# Patient Record
Sex: Female | Born: 1995 | Race: Black or African American | Hispanic: No | Marital: Married | Smoking: Never smoker
Health system: Southern US, Community
[De-identification: ages and names within clinical notes are randomized; demographics above are authoritative.]

## PROBLEM LIST (undated history)

## (undated) DIAGNOSIS — Z803 Family history of malignant neoplasm of breast: Secondary | ICD-10-CM

## (undated) DIAGNOSIS — D649 Anemia, unspecified: Secondary | ICD-10-CM

## (undated) HISTORY — DX: Anemia, unspecified: D64.9

## (undated) HISTORY — DX: Family history of malignant neoplasm of breast: Z80.3

---
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
## 2017-05-01 ENCOUNTER — Ambulatory Visit (HOSPITAL_BASED_OUTPATIENT_CLINIC_OR_DEPARTMENT_OTHER): Payer: 59 | Admitting: Genetic Counselor

## 2017-05-01 ENCOUNTER — Encounter: Payer: Self-pay | Admitting: Genetic Counselor

## 2017-05-01 ENCOUNTER — Other Ambulatory Visit: Payer: 59

## 2017-05-01 DIAGNOSIS — Z808 Family history of malignant neoplasm of other organs or systems: Secondary | ICD-10-CM | POA: Diagnosis not present

## 2017-05-01 DIAGNOSIS — Z315 Encounter for genetic counseling: Secondary | ICD-10-CM | POA: Diagnosis not present

## 2017-05-01 DIAGNOSIS — Z809 Family history of malignant neoplasm, unspecified: Secondary | ICD-10-CM | POA: Diagnosis not present

## 2017-05-01 DIAGNOSIS — Z803 Family history of malignant neoplasm of breast: Secondary | ICD-10-CM | POA: Diagnosis not present

## 2017-05-01 NOTE — Progress Notes (Addendum)
REFERRING PROVIDER: Tyson Dense, MD [address], [place] [address]  PRIMARY PROVIDER:  System, Pcp Not In  PRIMARY REASON FOR VISIT:  1. Family history of breast cancer      HISTORY OF PRESENT ILLNESS:   Tina Arias, a 20 y.o. female, was seen for a [facility] cancer genetics consultation at the request of Dr. Royston Sinner due to a family history of cancer.  Tina Arias presents to clinic today to discuss the possibility of a hereditary predisposition to cancer, genetic testing, and to further clarify her future cancer risks, as well as potential cancer risks for family members. Tina Arias is a 21 y.o. female with no personal history of cancer.  She was seen with her mother who had been diagnosed with breast cancer at age 66.  Her mother underwent genetic testing in 2006 was BRCA neg at that time.  CANCER HISTORY:   No history exists.     HORMONAL RISK FACTORS:  Menarche was at age 48.  First live birth at age N/A.  OCP use for approximately 5 years.  Ovaries intact: yes.  Hysterectomy: no.  Menopausal status: premenopausal.  HRT use: 0 years. Colonoscopy: no; not examined. Mammogram within the last year: no. Number of breast biopsies: 0. Up to date with pelvic exams:  yes. Any excessive radiation exposure in the past:  no  Past Medical History:  Diagnosis Date  . Anemia   . Family history of breast cancer     No past surgical history on file.  Social History   Socioeconomic History  . Marital status: Single    Spouse name: Not on file  . Number of children: Not on file  . Years of education: Not on file  . Highest education level: Not on file  Social Needs  . Financial resource strain: Not on file  . Food insecurity - worry: Not on file  . Food insecurity - inability: Not on file  . Transportation needs - medical: Not on file  . Transportation needs - non-medical: Not on file  Occupational History  . Not on file  Tobacco Use  . Smoking  status: Never Smoker  . Smokeless tobacco: Never Used  Substance and Sexual Activity  . Alcohol use: Not on file  . Drug use: Not on file  . Sexual activity: Yes    Birth control/protection: Pill  Other Topics Concern  . Not on file  Social History Narrative  . Not on file     FAMILY HISTORY:  We obtained a detailed, 4-generation family history.  Significant diagnoses are listed below: Family History  Problem Relation Age of Onset  . Breast cancer Mother 88  . Other Brother        car accident  . Cervical cancer Maternal Grandmother        dx in her 71s  . Throat cancer Maternal Grandfather        died in his 4s  . Other Paternal Grandmother 78    The patient does not have any children.  She is the only child to her parents, but had a maternal half brother who died in a car accident at age 1.  Both parents are living.  The patient's mother was diagnosed with breast cancer at age 52.  She had a lumpectomy, chemotherapy and radiation. She did not take tamoxifen, which suggests that she had ER neg breast cancer.  Her pathology report from 2006 indicates that the prognostic factors were  pending, but does not seem to have been updated.  Her mother underwent BRCA testing in 2006 and was negative.  This has not been updated. The patient's mother does not have full siblings.  She has two maternal half sisters and six paternal half siblings - three brothers and three sisters.  None have cancer.  The maternal grandparents are both deceased.  The grandfather was a smoker and died of throat cancer, and the grandmother died from complications of lupus but had cervical cancer in her 58's.  The patient's father was an only child. Both grandparents are living.  The grandmother had breast cancer at 22. She took tamoxifen which suggests an ER pos breast cancer. The grandfather is living and healthy.    Patient's maternal ancestors are of Native and [place] American descent, and paternal ancestors  are of Native and [place] American descent. There is no reported Ashkenazi Jewish ancestry. There is no known consanguinity.  GENETIC COUNSELING ASSESSMENT: Tina Arias is a 21 y.o. female with a family history of breast cancer which is somewhat suggestive of a hereditary cancer syndrome and predisposition to cancer. We, therefore, discussed and recommended the following at today's visit.   DISCUSSION: We discussed that about 5-10% of breast cancer is hereditary with most cases due to BRCA mutations.  Other hereditary genes associated with hereditary breast cancer include ATM, CHEK2 and PALB2.  Tina Arias meets criteria for testing based on her mother's diagnosis of breast cancer, but is not the most informative person to test - instead her mother would be more informative. We discussed that if her mother underwent genetic testing we could learn whether her breast was due to a hereditary cause, which would then be informative for the entire family.  By testing Tina Arias's mother, however, we would not learn if her grandmother may have passed on a genetic risk based on her breast cancer risk.  We discussed that the grandmother's breast cancer is over 50 and sounded based on treatment that it was ER pos.  There is also no other known cancer in that family. Therefore the chance that the grandmother's breast cancer based on NCCN criteria, is due to a hereditary cause is low.    We reviewed the characteristics, features and inheritance patterns of hereditary cancer syndromes. We also discussed genetic testing, including the appropriate family members to test, the process of testing, insurance coverage and turn-around-time for results. We discussed the implications of a negative, positive and/or variant of uncertain significant result. We recommended Tina Arias's mother undergo genetic testing and then based on her results, pursue testing in the future.  We discussed that some people do not want to undergo  genetic testing due to fear of genetic discrimination.  A federal law called the Genetic Information Non-Discrimination Act (GINA) of 2008 helps protect individuals against genetic discrimination based on their genetic test results.  It impacts both health insurance and employment.  With health insurance, it protects against increased premiums, being kicked off insurance or being forced to take a test in order to be insured.  For employment it protects against hiring, firing and promoting decisions based on genetic test results.  Health status due to a cancer diagnosis is not protected under GINA.   Based on Tina Arias's family history of cancer, she meets medical criteria for genetic testing. Despite that she meets criteria, she may still have an out of pocket cost. We discussed that if her out of pocket cost for testing is over $100, the  laboratory will call and confirm whether she wants to proceed with testing.  If the out of pocket cost of testing is less than $100 she will be billed by the genetic testing laboratory.   In order to estimate her chance of having a BRCA mutation, we used statistical models (Tyrer Cusik) and laboratory data that take into account her personal medical history, family history and ancestry.  Because each model is different, there can be a lot of variability in the risks they give.  Therefore, these numbers must be considered a rough range and not a precise risk of having a BRCA mutation.  This model estimates that she has approximately a 0.53% chance of having a mutation.   Based on the patient's personal and family history, statistical models (Tyrer Cusik)  and literature data were used to estimate her risk of developing breast cancer. This estimates her lifetime risk of developing breast cancer to be approximately 34.1%. The patient's lifetime breast cancer risk is a preliminary estimate based on available information using one of several models endorsed by the [place] (ACS). The ACS recommends consideration of breast MRI screening as an adjunct to mammography for patients at high risk (defined as 20% or greater lifetime risk). A more detailed breast cancer risk assessment can be considered, if clinically indicated.   Tina Arias has been determined to be at high risk for breast cancer.  Therefore, we recommend that annual screening with mammography and breast MRI begin at age 66, or 10 years prior to the age of breast cancer diagnosis in a relative (whichever is earlier).  We discussed that Ms. Kasal should discuss her individual situation with her referring physician and determine a breast cancer screening plan with which they are both comfortable.       PLAN: Based on Tina Arias's family history, we recommended her mother, who was diagnosed with breast cancer at age 89, have genetic counseling and testing. Since her mother attended today's visit, this testing was drawn.  Based on the results of this test will determine whether Tina Arias will undergo genetic testing in the future.   Lastly, we encouraged Tina Arias to remain in contact with cancer genetics annually so that we can continuously update the family history and inform her of any changes in cancer genetics and testing that may be of benefit for this family.   Ms.  Arias questions were answered to her satisfaction today. Our contact information was provided should additional questions or concerns arise. Thank you for the referral and allowing [place] to share in the care of your patient.   Tina Arias P. Florene Glen, [place], [facility] Certified Genetic Counselor [email or web address] phone: [phone]  The patient was seen for a total of 60 minutes in face-to-face genetic counseling.  This patient was discussed with Drs. Magrinat, Lindi Adie and/or Burr Medico who agrees with the above.    _______________________________________________________________________ For Office Staff:  Number of people involved in  session: 2 Was an Intern/ student involved with case: no

## 2018-02-21 DIAGNOSIS — Z01419 Encounter for gynecological examination (general) (routine) without abnormal findings: Secondary | ICD-10-CM | POA: Diagnosis not present

## 2018-02-21 DIAGNOSIS — Z6823 Body mass index (BMI) 23.0-23.9, adult: Secondary | ICD-10-CM | POA: Diagnosis not present

## 2018-03-05 DIAGNOSIS — Z0000 Encounter for general adult medical examination without abnormal findings: Secondary | ICD-10-CM | POA: Diagnosis not present

## 2018-03-05 DIAGNOSIS — Z23 Encounter for immunization: Secondary | ICD-10-CM | POA: Diagnosis not present

## 2018-04-09 DIAGNOSIS — Z23 Encounter for immunization: Secondary | ICD-10-CM | POA: Diagnosis not present

## 2021-06-23 ENCOUNTER — Other Ambulatory Visit: Payer: Self-pay

## 2021-06-23 ENCOUNTER — Ambulatory Visit
Admission: RE | Admit: 2021-06-23 | Discharge: 2021-06-23 | Disposition: A | Discharge status: Home / Self Care | Payer: BC Managed Care – PPO | Source: Ambulatory Visit | Attending: Emergency Medicine | Admitting: Emergency Medicine

## 2021-06-23 VITALS — BP 117/78 | HR 95 | Temp 98.8°F | Resp 18

## 2021-06-23 DIAGNOSIS — L739 Follicular disorder, unspecified: Secondary | ICD-10-CM | POA: Diagnosis not present

## 2021-06-23 MED ORDER — SULFAMETHOXAZOLE-TRIMETHOPRIM 800-160 MG PO TABS: 1.0000 | ORAL_TABLET | Freq: Two times a day (BID) | ORAL | 0 refills | Status: AC

## 2021-06-23 MED ORDER — MUPIROCIN 2 % EX OINT: 1.0000 "application " | TOPICAL_OINTMENT | Freq: Two times a day (BID) | CUTANEOUS | 0 refills | Status: DC

## 2021-06-23 NOTE — Discharge Instructions (Addendum)
You have developed minor but aggravating folliculitis on your bottom.  This typically caused by either strep or staph bacteria.  Is a very common bacteria that are pervasive in our environment, occasionally they become a little bit overloaded on our bodies and rashes like the one you have can occur.  I recommend decontamination and a short course of antibiotics.  I sent a prescription for Bactrim to your pharmacy.  Please take 1 tablet twice daily for the next 7 days.  I have sent a prescription for mupirocin ointment to your pharmacy.  Please use a Q-tip to apply mupirocin ointment to the inside of your nose once weekly for the next 4 weeks to eliminate any excessive colonization in your nose, this is typically worse staph and strep congregate on bodies.  You can also apply this ointment to the rash twice daily while it is healing.  Decontamination includes weekly application of mupirocin as above, washing all bedding and undergarments in the hottest water possible, bleaching everything that you can, drying them on a hot dryer, and bleach baths.  Bleach baths are performed by adding 1/4 to 1/2 cup of bleach to a tepid bath and a standard sized, soaking for about 20 minutes, then standing and washing the bleach from your body with soap and water.  This is recommended once monthly for anyone who suffers from bacterial skin infections.  Please follow-up with either your primary care provider or with [place] here urgent care if you have not had significant improvement of your rash after 7 days.  Thank you for visiting urgent care.  I appreciate the opportunity to participate in your care.

## 2021-06-23 NOTE — ED Provider Notes (Signed)
[facility]    CSN: [id number] Arrival date & time: 06/23/21  1223    HISTORY   Chief Complaint  Patient presents with   Rash   appt 1230   HPI Tina Arias is a 26 y.o. female. [facility] for rash to right buttocks on and off for at least 2 weeks.  States sometimes it is itchy, sometimes it feels painful to the touch.  Has not tried anything at home for it other than washing with antibacterial soap, applying lotion and applying antibiotic ointment.    The history is provided by the patient.  Past Medical History:  Diagnosis Date   Anemia    Family history of breast cancer    Patient Active Problem List   Diagnosis Date Noted   Family history of breast cancer    History reviewed. No pertinent surgical history. OB History   No obstetric history on file.    Home Medications    Prior to Admission medications   Medication Sig Start Date End Date Taking? Authorizing Provider  mupirocin ointment (BACTROBAN) 2 % Apply 1 application topically 2 (two) times daily. 06/23/21  Yes Theadora RamaMorgan, Zailyn Thoennes Scales, PA-C  sulfamethoxazole-trimethoprim (BACTRIM DS) 800-160 MG tablet Take 1 tablet by mouth 2 (two) times daily for 7 days. 06/23/21 06/30/21 Yes Theadora RamaMorgan, Brayn Eckstein Scales, PA-C  norethindrone-ethinyl estradiol (JUNEL FE,GILDESS FE,LOESTRIN FE) 1-20 MG-MCG tablet Take 1 tablet by mouth daily.    [provider]    Family History Family History  Problem Relation Age of Onset   Breast cancer Mother 2640   Other Brother        car accident   Cervical cancer Maternal Grandmother        dx in her 30s   Throat cancer Maternal Grandfather        died in his 6350s   Other Paternal Grandmother [id number]   Social History Social History   Tobacco Use   Smoking status: Never   Smokeless tobacco: Never   Allergies   Patient has no known allergies.  Review of Systems Review of Systems Pertinent findings noted in history of present illness.    Physical Exam Triage Vital Signs ED Triage Vitals  Enc Vitals Group     BP 03/31/21 0827 (!) 147/82     Pulse Rate 03/31/21 0827 72     Resp 03/31/21 0827 18     Temp 03/31/21 0827 98.3 F (36.8 C)     Temp Source 03/31/21 0827 Oral     SpO2 03/31/21 0827 98 %     Weight --      Height --      Head Circumference --      Peak Flow --      Pain Score 03/31/21 0826 5     Pain Loc --      Pain Edu? --      Excl. in GC? --   No data found.  Updated Vital Signs BP 117/78 (BP Location: Right Arm)    Pulse 95    Temp 98.8 F (37.1 C) (Oral)    Resp 18    SpO2 99%   Physical Exam Vitals and nursing note reviewed.  Constitutional:      General: She is not in acute distress.    Appearance: Normal appearance. She is not ill-appearing.  HENT:     Head: Normocephalic and atraumatic.  Eyes:     General: Lids are normal.  Right eye: No discharge.        Left eye: No discharge.     Extraocular Movements: Extraocular movements intact.     Conjunctiva/sclera: Conjunctivae normal.     Right eye: Right conjunctiva is not injected.     Left eye: Left conjunctiva is not injected.  Neck:     Trachea: Trachea and phonation normal.  Cardiovascular:     Rate and Rhythm: Normal rate and regular rhythm.     Pulses: Normal pulses.     Heart sounds: Normal heart sounds. No murmur heard.   No friction rub. No gallop.  Pulmonary:     Effort: Pulmonary effort is normal. No accessory muscle usage, prolonged expiration or respiratory distress.     Breath sounds: Normal breath sounds. No stridor, decreased air movement or transmitted upper airway sounds. No decreased breath sounds, wheezing, rhonchi or rales.  Chest:     Chest wall: No tenderness.  Musculoskeletal:        General: Normal range of motion.     Cervical back: Normal range of motion and neck supple. Normal range of motion.  Lymphadenopathy:     Cervical: No cervical adenopathy.  Skin:    General: Skin is warm and dry.      Findings: Lesion (Multiple 1 mm punctate lesions with white fluid with mild surrounding erythema.  No visible signs of excoriation or cellulitis appreciated.) present. No erythema or rash.  Neurological:     General: No focal deficit present.     Mental Status: She is alert and oriented to person, place, and time.  Psychiatric:        Mood and Affect: Mood normal.        Behavior: Behavior normal.    Visual Acuity Right Eye Distance:   Left Eye Distance:   Bilateral Distance:    Right Eye Near:   Left Eye Near:    Bilateral Near:     UC Couse / Diagnostics / Procedures:    EKG  Radiology No results found.  Procedures Procedures (including critical care time)  UC Diagnoses / Final Clinical Impressions(s)   I have reviewed the triage vital signs and the nursing notes.  Pertinent labs & imaging results that were available during my care of the patient were reviewed by me and considered in my medical decision making (see chart for details).    Final diagnoses:  Folliculitis   Patient advised to begin short course of Bactrim, apply mupirocin ointment to lesions twice daily and to perform decontamination at home.  Return precautions advised.  ED Prescriptions     Medication Sig Dispense Auth. Provider   sulfamethoxazole-trimethoprim (BACTRIM DS) 800-160 MG tablet Take 1 tablet by mouth 2 (two) times daily for 7 days. 14 tablet Theadora Rama Scales, PA-C   mupirocin ointment (BACTROBAN) 2 % Apply 1 application topically 2 (two) times daily. 30 g Theadora Rama Scales, PA-C      PDMP not reviewed this encounter.  Pending results:  Labs Reviewed - No data to display  Medications Ordered in UC: Medications - No data to display  Disposition Upon Discharge:  Condition: stable for discharge home Home: take medications as prescribed; routine discharge instructions as discussed; follow up as advised.  Patient presented with an acute illness with associated systemic  symptoms and significant discomfort requiring urgent management. In my opinion, this is a condition that a prudent lay person (someone who possesses an average knowledge of health and medicine) may potentially expect to result  in complications if not addressed urgently such as respiratory distress, impairment of bodily function or dysfunction of bodily organs.   Routine symptom specific, illness specific and/or disease specific instructions were discussed with the patient and/or caregiver at length.   As such, the patient has been evaluated and assessed, work-up was performed and treatment was provided in alignment with urgent care protocols and evidence based medicine.  Patient/parent/caregiver has been advised that the patient may require follow up for further testing and treatment if the symptoms continue in spite of treatment, as clinically indicated and appropriate.  If the patient was tested for COVID-19, Influenza and/or RSV, then the patient/parent/guardian was advised to isolate at home pending the results of his/her diagnostic coronavirus test and potentially longer if theyre positive. I have also advised pt that if his/her COVID-19 test returns positive, it's recommended to self-isolate for at least 10 days after symptoms first appeared AND until fever-free for 24 hours without fever reducer AND other symptoms have improved or resolved. Discussed self-isolation recommendations as well as instructions for household member/close contacts as per the [facility] and [place] DHHS, and also gave patient the COVID packet with this information.  Patient/parent/caregiver has been advised to return to [facility] or PCP in 3-5 days if no better; to PCP or the Emergency Department if new signs and symptoms develop, or if the current signs or symptoms continue to change or worsen for further workup, evaluation and treatment as clinically indicated and appropriate  The patient will follow up with their current PCP if and as  advised. If the patient does not currently have a PCP we will assist them in obtaining one.   The patient may need specialty follow up if the symptoms continue, in spite of conservative treatment and management, for further workup, evaluation, consultation and treatment as clinically indicated and appropriate.   Patient/parent/caregiver verbalized understanding and agreement of plan as discussed.  All questions were addressed during visit.  Please see discharge instructions below for further details of plan.  Discharge Instructions:   Discharge Instructions      You have developed minor but aggravating folliculitis on your bottom.  This typically caused by either strep or staph bacteria.  Is a very common bacteria that are pervasive in our environment, occasionally they become a little bit overloaded on our bodies and rashes like the one you have can occur.  I recommend decontamination and a short course of antibiotics.  I sent a prescription for Bactrim to your pharmacy.  Please take 1 tablet twice daily for the next 7 days.  I have sent a prescription for mupirocin ointment to your pharmacy.  Please use a Q-tip to apply mupirocin ointment to the inside of your nose once weekly for the next 4 weeks to eliminate any excessive colonization in your nose, this is typically worse staph and strep congregate on bodies.  You can also apply this ointment to the rash twice daily while it is healing.  Decontamination includes weekly application of mupirocin as above, washing all bedding and undergarments in the hottest water possible, bleaching everything that you can, drying them on a hot dryer, and bleach baths.  Bleach baths are performed by adding 1/4 to 1/2 cup of bleach to a tepid bath and a standard sized, soaking for about 20 minutes, then standing and washing the bleach from your body with soap and water.  This is recommended once monthly for anyone who suffers from bacterial skin  infections.  Please follow-up with either  your primary care provider or with [place] [facility] urgent care if you have not had significant improvement of your rash after 7 days.  Thank you for visiting urgent care.  I appreciate the opportunity to participate in your care.    This office note has been dictated using Teaching laboratory technician.  Unfortunately, and despite my best efforts, this method of dictation can sometimes lead to occasional typographical or grammatical errors.  I apologize in advance if this occurs.     Theadora Rama Scales, PA-C 06/23/21 1331

## 2021-06-23 NOTE — ED Triage Notes (Signed)
Patient presents to [facility] for evaluation for rash to right buttocks on and off for at least 2 weeks.  States sometimes it is itchy, sometimes it feels painful to the touch.  Has not tried anything at home for it.

## 2022-09-03 ENCOUNTER — Telehealth: Payer: Self-pay | Admitting: Hematology and Oncology

## 2022-09-03 NOTE — Telephone Encounter (Signed)
scheduled per 4/1 referral , pt has been called and confirmed date and time. Pt is aware of location and to arrive early for check in

## 2022-09-13 ENCOUNTER — Other Ambulatory Visit: Payer: BC Managed Care – PPO

## 2022-09-13 ENCOUNTER — Encounter: Payer: BC Managed Care – PPO | Admitting: Hematology and Oncology

## 2022-09-17 ENCOUNTER — Other Ambulatory Visit: Payer: Self-pay | Admitting: Physician Assistant

## 2022-09-17 DIAGNOSIS — D509 Iron deficiency anemia, unspecified: Secondary | ICD-10-CM

## 2022-09-17 NOTE — Progress Notes (Unsigned)
[facility] [phone]   [phone]  CONSULT NOTE  REFERRING PHYSICIAN: Dr. Elon Spanner   REASON FOR CONSULTATION:  Anemia   HPI Tina Arias is a 27 y.o. female with no significant past medical history is referred to the clinic for iron deficiency anemia.  The patient recently had a follow-up appointment with her gynecologist, Dr. Elon Spanner in March 2024.  Her hemoglobin was found to be 7.0 with a low MCV at 62 and elevated RDW at 17.3 on 08/29/2022.  Her ferritin was very low at 3.  She was referred to the clinic for further evaluation and management.  She is not presently taking an iron supplement because she wanted to hear our recommendation.  She has never required any iron supplements before.   I do not have any prior records.  She is not sure when she started to become anemic but it may have been since she was a teenager.  She denies ever needing a blood transfusion or an iron infusion before.    She previously was on birth control in which she did not have any menstrual cycles.  She is no longer taking birth control and she has a menstrual cycle every 4 weeks lasting approximately 4-5 days days.  She does not feel that her cycles are excessively heavy.  She uses large pads and may change them every 2-3 hours.  She denies any spotting in between her cycles.    Overall regarding symptoms, she has some increased fatigue but she is not sure if this is attributed to the iron or not sleeping due to her studies.  She is in school for physical therapy.  She graduates in December.  She occasionally has lightheadedness and mild dyspnea on exertion.  She may intermittently have palpitations but they do not last.  She denies gingival bleeding, epistaxis, hemoptysis, hematemesis, hematochezia, or melena.  Denies any changes in bowel habits.  Denies any fever, chills, or night sweats (unless it is a warm sleeping environment).  She denies any lymphadenopathy.  She may  take 2 to 3 tablets of ibuprofen once a week for headaches.  She denies any particular dietary habits such as being a vegan or vegetarian, but she does not eat a lot of red because her husband has hypertension and he has some dietary restrictions yet.  She still estimates she eats red meat approximately 2-3 times per week.  She does not crave any ice chips.  Denies any history of bariatric surgery.    She denies any family history of sickle cell anemia or trait.  Her mom has some anemia but it is mild and she has not required any blood transfusions or infusions.  Her mother has breast cancer.  Her paternal grandmother also had breast cancer.  Denies any other family history of cancer.  The patient is currently in school for physical therapy.  She is married.  She does not have any children.  Denies any drug, alcohol, or tobacco use.    HPI  Past Medical History:  Diagnosis Date   Anemia    Family history of breast cancer     No past surgical history on file.  Family History  Problem Relation Age of Onset   Breast cancer Mother 70   Other Brother        car accident   Cervical cancer Maternal Grandmother        dx in her 30s   Throat cancer Maternal Grandfather  died in his 63s   Other Paternal Grandmother 70    Social History Social History   Tobacco Use   Smoking status: Never   Smokeless tobacco: Never    No Known Allergies  Current Outpatient Medications  Medication Sig Dispense Refill   mupirocin ointment (BACTROBAN) 2 % Apply 1 application topically 2 (two) times daily. 30 g 0   norethindrone-ethinyl estradiol (JUNEL FE,GILDESS FE,LOESTRIN FE) 1-20 MG-MCG tablet Take 1 tablet by mouth daily.     No current facility-administered medications for this visit.    REVIEW OF SYSTEMS:   Review of Systems  Constitutional: Positive for fatigue.  Negative for appetite change, chills, fever and unexpected weight change.  HENT: Negative for mouth sores, nosebleeds,  sore throat and trouble swallowing.   Eyes: Negative for eye problems and icterus.  Respiratory: Positive for occasional dyspnea on exertion.  Negative for cough, hemoptysis, and wheezing.   Cardiovascular: Negative for chest pain and leg swelling.  Gastrointestinal: Negative for abdominal pain, constipation, diarrhea, nausea and vomiting.  Genitourinary: Negative for bladder incontinence, difficulty urinating, dysuria, frequency and hematuria.   Musculoskeletal: Negative for back pain, gait problem, neck pain and neck stiffness.  Skin: Negative for itching and rash.  Neurological: Positive for occasional lightheadedness.  Negative for dizziness, extremity weakness, gait problem, headaches, and seizures.  Hematological: Negative for adenopathy. Does not bruise/bleed easily.  Psychiatric/Behavioral: Negative for confusion, depression and sleep disturbance. The patient is not nervous/anxious.     PHYSICAL EXAMINATION:  Blood pressure 122/71, pulse 80, temperature 98.2 F (36.8 C), temperature source Oral, resp. rate 18, height  (1.778 m), weight 161 lb 3.2 oz (73.1 kg), SpO2 100 %.  ECOG PERFORMANCE STATUS: 1  Physical Exam  Constitutional: Oriented to person, place, and time and well-developed, well-nourished, and in no distress.  HENT:  Head: Normocephalic and atraumatic.  Mouth/Throat: Oropharynx is clear and moist. No oropharyngeal exudate.  Eyes: Conjunctivae are normal. Right eye exhibits no discharge. Left eye exhibits no discharge. No scleral icterus.  Neck: Normal range of motion. Neck supple.  Cardiovascular: Normal rate, regular rhythm, normal heart sounds and intact distal pulses.   Pulmonary/Chest: Effort normal and breath sounds normal. No respiratory distress. No wheezes. No rales.  Abdominal: Soft. Bowel sounds are normal. Exhibits no distension and no mass. There is no tenderness.  Musculoskeletal: Normal range of motion. Exhibits no edema.  Lymphadenopathy:    No  cervical adenopathy.  Neurological: Alert and oriented to person, place, and time. Exhibits normal muscle tone. Gait normal. Coordination normal.  Skin: Skin is warm and dry. No rash noted. Not diaphoretic. No erythema. No pallor.  Psychiatric: Mood, memory and judgment normal.  Vitals reviewed.  LABORATORY DATA: Lab Results  Component Value Date   WBC 3.0 (L) 09/18/2022   HGB 6.9 (LL) 09/18/2022   HCT 26.1 (L) 09/18/2022   MCV 59.3 (L) 09/18/2022   PLT 317 09/18/2022      Chemistry   No results found for: "NA", "K", "CL", "CO2", "BUN", "CREATININE", "GLU" No results found for: "CALCIUM", "ALKPHOS", "AST", "ALT", "BILITOT"     RADIOGRAPHIC STUDIES: No results found.  ASSESSMENT: This is a very pleasant 27 year old female referred to the clinic for iron deficiency anemia.   The patient had several lab studies performed today including CBC, CMP, iron studies, ferritin, B12, folate, and hemoglobin electrophoresis.  The patient's labs from today demonstrate significant microcytic anemia with a hemoglobin of 6.9, very low MCV at 59.3 and elevated RDW at 19.6.  She also has a white blood cell count low at 3.0 with neutropenia with an ANC of 1.0.  From her referral records, she had low end of normal white blood cell count that day with a white blood cell count of 3.5.  We will continue to monitor this.  Her CMP is unremarkable.  Her other lab studies are pending at this time.  We anticipate the patient's iron will be low.  She likely will benefit from iron infusions with Venofer 400 mg weekly x 3. We will see if this can be given on Saturday 09/22/22.   We will hold off on a blood transfusion at this time but she was advised if she has new or worsening symptoms in the interval.  She also was instructed to take an over-the-counter iron supplement 1 tablet p.o. daily with vitamin C.  She is also given a handout on iron rich food on her AVS and she was strongly encouraged to increase her  dietary intake of iron rich food.  Will see her back for follow-up visit in 2 months for evaluation repeat blood work.  Of course, if the patient develops any new or worsening signs and symptoms of anemia we would be happy to see her sooner.  The patient voices understanding of current disease status and treatment options and is in agreement with the current care plan.  All questions were answered. The patient knows to call the clinic with any problems, questions or concerns. We can certainly see the patient much sooner if necessary.  Thank you so much for allowing me to participate in the care of Jaquelyn Bitter. I will continue to follow up the patient with you and assist in her care.   Disclaimer: This note was dictated with voice recognition software. Similar sounding words can inadvertently be transcribed and may not be corrected upon review.   Blimy Napoleon L Jamaris Biernat September 18, 2022, 2:39 PM   ADDENDUM: Hematology/Oncology Attending:  I had a face-to-face encounter with the patient today.  I reviewed her record, lab and recommended her care plan.  This is a very pleasant 27 years old African-American female with history of persistent iron deficiency anemia secondary to heavy menstrual blood loss.  The patient has not been taking any iron supplements but has been complaining of increasing fatigue and weakness and she was referred to [place] today for evaluation and recommendation regarding her anemia.  She has no family history of sickle cell or thalassemia. When seen today she continues to complain of increasing fatigue and weakness. I had a lengthy discussion with the patient today about her condition and treatment options. Repeat blood work today including CBC showed hemoglobin of 6.9 and hematocrit 26.1.  She also has low white blood count of 3.0.  Iron study showed serum iron of 15 with iron saturation of 3% and TIBC of 542.  She has normal vitamin B12 level.  Serum folate and ferritin  level as well as hemoglobin electrophoresis are still pending. I recommended for the patient to proceed with iron infusion with Venofer 400 Mg IV weekly for 3 weeks. We will see the patient back for follow-up visit in 2 months for evaluation and repeat blood work. The patient was advised to call immediately if she has any concerning symptoms in the interval. The total time spent in the appointment was 60 minutes. Disclaimer: This note was dictated with voice recognition software. Similar sounding words can inadvertently be transcribed and may be missed upon review. Lajuana Matte,  MD

## 2022-09-18 ENCOUNTER — Inpatient Hospital Stay: Payer: BC Managed Care – PPO

## 2022-09-18 ENCOUNTER — Telehealth: Payer: Self-pay | Admitting: Physician Assistant

## 2022-09-18 ENCOUNTER — Other Ambulatory Visit: Payer: Self-pay

## 2022-09-18 ENCOUNTER — Other Ambulatory Visit: Payer: Self-pay | Admitting: Physician Assistant

## 2022-09-18 ENCOUNTER — Inpatient Hospital Stay: Payer: BC Managed Care – PPO | Attending: Hematology and Oncology | Admitting: Physician Assistant

## 2022-09-18 ENCOUNTER — Telehealth: Payer: Self-pay | Admitting: *Deleted

## 2022-09-18 VITALS — BP 122/71 | HR 80 | Temp 98.2°F | Resp 18 | Ht 70.0 in | Wt 161.2 lb

## 2022-09-18 DIAGNOSIS — Z8049 Family history of malignant neoplasm of other genital organs: Secondary | ICD-10-CM | POA: Insufficient documentation

## 2022-09-18 DIAGNOSIS — E611 Iron deficiency: Secondary | ICD-10-CM | POA: Insufficient documentation

## 2022-09-18 DIAGNOSIS — Z803 Family history of malignant neoplasm of breast: Secondary | ICD-10-CM | POA: Insufficient documentation

## 2022-09-18 DIAGNOSIS — D500 Iron deficiency anemia secondary to blood loss (chronic): Secondary | ICD-10-CM | POA: Diagnosis not present

## 2022-09-18 DIAGNOSIS — Z793 Long term (current) use of hormonal contraceptives: Secondary | ICD-10-CM | POA: Diagnosis not present

## 2022-09-18 DIAGNOSIS — D509 Iron deficiency anemia, unspecified: Secondary | ICD-10-CM | POA: Diagnosis present

## 2022-09-18 DIAGNOSIS — Z801 Family history of malignant neoplasm of trachea, bronchus and lung: Secondary | ICD-10-CM | POA: Diagnosis not present

## 2022-09-18 LAB — CBC WITH DIFFERENTIAL (CANCER CENTER ONLY)
Abs Immature Granulocytes: 0 10*3/uL (ref 0.00–0.07)
Basophils Absolute: 0 10*3/uL (ref 0.0–0.1)
Basophils Relative: 1 %
Eosinophils Absolute: 0.1 10*3/uL (ref 0.0–0.5)
Eosinophils Relative: 2 %
HCT: 26.1 % — ABNORMAL LOW (ref 36.0–46.0)
Hemoglobin: 6.9 g/dL — CL (ref 12.0–15.0)
Immature Granulocytes: 0 %
Lymphocytes Relative: 61 %
Lymphs Abs: 1.8 10*3/uL (ref 0.7–4.0)
MCH: 15.7 pg — ABNORMAL LOW (ref 26.0–34.0)
MCHC: 26.4 g/dL — ABNORMAL LOW (ref 30.0–36.0)
MCV: 59.3 fL — ABNORMAL LOW (ref 80.0–100.0)
Monocytes Absolute: 0.1 10*3/uL (ref 0.1–1.0)
Monocytes Relative: 4 %
Neutro Abs: 1 10*3/uL — ABNORMAL LOW (ref 1.7–7.7)
Neutrophils Relative %: 32 %
Platelet Count: 317 10*3/uL (ref 150–400)
RBC: 4.4 MIL/uL (ref 3.87–5.11)
RDW: 19.6 % — ABNORMAL HIGH (ref 11.5–15.5)
WBC Count: 3 10*3/uL — ABNORMAL LOW (ref 4.0–10.5)
nRBC: 0 % (ref 0.0–0.2)

## 2022-09-18 LAB — CMP (CANCER CENTER ONLY)
ALT: 7 U/L (ref 0–44)
AST: 18 U/L (ref 15–41)
Albumin: 4.5 g/dL (ref 3.5–5.0)
Alkaline Phosphatase: 42 U/L (ref 38–126)
Anion gap: 6 (ref 5–15)
BUN: 10 mg/dL (ref 6–20)
CO2: 26 mmol/L (ref 22–32)
Calcium: 9.6 mg/dL (ref 8.9–10.3)
Chloride: 108 mmol/L (ref 98–111)
Creatinine: 0.86 mg/dL (ref 0.44–1.00)
GFR, Estimated: >60 mL/min (ref >60)
Glucose, Bld: 82 mg/dL (ref 70–99)
Potassium: 3.6 mmol/L (ref 3.5–5.1)
Sodium: 140 mmol/L (ref 135–145)
Total Bilirubin: 0.3 mg/dL (ref 0.3–1.2)
Total Protein: 7.8 g/dL (ref 6.5–8.1)

## 2022-09-18 LAB — IRON AND IRON BINDING CAPACITY (CC-WL,HP ONLY)
Iron: 15 ug/dL — ABNORMAL LOW (ref 28–170)
Saturation Ratios: 3 % — ABNORMAL LOW (ref 10.4–31.8)
TIBC: 542 ug/dL — ABNORMAL HIGH (ref 250–450)
UIBC: 527 ug/dL — ABNORMAL HIGH (ref 148–442)

## 2022-09-18 LAB — SAMPLE TO BLOOD BANK

## 2022-09-18 LAB — VITAMIN B12: Vitamin B-12: 461 pg/mL (ref 180–914)

## 2022-09-18 LAB — FOLATE: Folate: 33.9 ng/mL (ref >5.9)

## 2022-09-18 LAB — FERRITIN: Ferritin: 1 ng/mL — ABNORMAL LOW (ref 11–307)

## 2022-09-18 LAB — ABO/RH: ABO/RH(D): B POS

## 2022-09-18 NOTE — Telephone Encounter (Signed)
CRITICAL VALUE STICKER  CRITICAL VALUE: Hgb 6.9  RECEIVER (on-site recipient of call): Winferd Wease,LPN  DATE & TIME NOTIFIED: 4/16 1400  MESSENGER (representative from lab):  MD NOTIFIED: Cassie H, PA  TIME OF NOTIFICATION: 1403  RESPONSE: Noted

## 2022-09-18 NOTE — Telephone Encounter (Signed)
Contacted patient to scheduled appointments. Patient is aware of appointments that are scheduled.   

## 2022-09-19 ENCOUNTER — Telehealth: Payer: Self-pay | Admitting: Physician Assistant

## 2022-09-19 NOTE — Telephone Encounter (Signed)
Patient called to move her appointment next Thursday to next Friday.

## 2022-09-20 ENCOUNTER — Inpatient Hospital Stay: Payer: BC Managed Care – PPO

## 2022-09-20 ENCOUNTER — Other Ambulatory Visit: Payer: Self-pay

## 2022-09-20 VITALS — BP 119/69 | HR 74 | Temp 98.8°F | Resp 18

## 2022-09-20 DIAGNOSIS — D509 Iron deficiency anemia, unspecified: Secondary | ICD-10-CM | POA: Diagnosis not present

## 2022-09-20 DIAGNOSIS — D500 Iron deficiency anemia secondary to blood loss (chronic): Secondary | ICD-10-CM

## 2022-09-20 LAB — HGB FRACTIONATION CASCADE
Hgb A2: 1.8 % (ref 1.8–3.2)
Hgb A: 98.2 % (ref 96.4–98.8)
Hgb F: 0 % (ref 0.0–2.0)
Hgb S: 0 %

## 2022-09-20 MED ORDER — ACETAMINOPHEN 325 MG PO TABS
650.0000 mg | ORAL_TABLET | Freq: Once | ORAL | Status: AC
  Administered 2022-09-20: 650 mg via ORAL
  Filled 2022-09-20: qty 2

## 2022-09-20 MED ORDER — CETIRIZINE HCL 10 MG PO TABS
10.0000 mg | ORAL_TABLET | Freq: Once | ORAL | Status: AC
  Administered 2022-09-20: 10 mg via ORAL
  Filled 2022-09-20: qty 1

## 2022-09-20 MED ORDER — SODIUM CHLORIDE 0.9 % IV SOLN
400.0000 mg | Freq: Once | INTRAVENOUS | Status: AC
  Administered 2022-09-20: 400 mg via INTRAVENOUS
  Filled 2022-09-20: qty 20

## 2022-09-20 MED ORDER — SODIUM CHLORIDE 0.9 % IV SOLN: Freq: Once | INTRAVENOUS | Status: AC

## 2022-09-20 NOTE — Patient Instructions (Signed)

## 2022-09-20 NOTE — Progress Notes (Signed)
Patient tolerated IV iron infusion well. Observed 30 minutes post infusion. Patient discharged with significant other, VSS.

## 2022-09-27 ENCOUNTER — Ambulatory Visit: Payer: BC Managed Care – PPO

## 2022-09-28 ENCOUNTER — Inpatient Hospital Stay: Payer: BC Managed Care – PPO

## 2022-10-04 ENCOUNTER — Other Ambulatory Visit: Payer: Self-pay

## 2022-10-04 ENCOUNTER — Inpatient Hospital Stay: Payer: BC Managed Care – PPO | Attending: Hematology and Oncology

## 2022-10-04 VITALS — BP 110/69 | HR 70 | Temp 98.5°F | Resp 16

## 2022-10-04 DIAGNOSIS — D500 Iron deficiency anemia secondary to blood loss (chronic): Secondary | ICD-10-CM

## 2022-10-04 DIAGNOSIS — D509 Iron deficiency anemia, unspecified: Secondary | ICD-10-CM | POA: Diagnosis present

## 2022-10-04 DIAGNOSIS — Z79899 Other long term (current) drug therapy: Secondary | ICD-10-CM | POA: Insufficient documentation

## 2022-10-04 MED ORDER — ACETAMINOPHEN 325 MG PO TABS
650.0000 mg | ORAL_TABLET | Freq: Once | ORAL | Status: AC
  Administered 2022-10-04: 650 mg via ORAL
  Filled 2022-10-04: qty 2

## 2022-10-04 MED ORDER — SODIUM CHLORIDE 0.9 % IV SOLN: Freq: Once | INTRAVENOUS | Status: AC

## 2022-10-04 MED ORDER — SODIUM CHLORIDE 0.9 % IV SOLN
400.0000 mg | Freq: Once | INTRAVENOUS | Status: AC
  Administered 2022-10-04: 400 mg via INTRAVENOUS
  Filled 2022-10-04: qty 20

## 2022-10-04 MED ORDER — CETIRIZINE HCL 10 MG PO TABS
10.0000 mg | ORAL_TABLET | Freq: Once | ORAL | Status: AC
  Administered 2022-10-04: 10 mg via ORAL
  Filled 2022-10-04: qty 1

## 2022-10-04 NOTE — Progress Notes (Signed)
Pt declined staying for 30 minute post-observation following Venofer infusion. Pt stable at d/c to lobby and VSS. See flowsheet.

## 2022-10-12 ENCOUNTER — Inpatient Hospital Stay: Payer: BC Managed Care – PPO

## 2022-10-12 ENCOUNTER — Other Ambulatory Visit: Payer: Self-pay

## 2022-10-12 VITALS — BP 118/76 | HR 70 | Temp 98.0°F | Resp 18

## 2022-10-12 DIAGNOSIS — D500 Iron deficiency anemia secondary to blood loss (chronic): Secondary | ICD-10-CM

## 2022-10-12 DIAGNOSIS — D509 Iron deficiency anemia, unspecified: Secondary | ICD-10-CM | POA: Diagnosis not present

## 2022-10-12 MED ORDER — SODIUM CHLORIDE 0.9 % IV SOLN: Freq: Once | INTRAVENOUS | Status: AC

## 2022-10-12 MED ORDER — ACETAMINOPHEN 325 MG PO TABS
650.0000 mg | ORAL_TABLET | Freq: Once | ORAL | Status: AC
  Administered 2022-10-12: 650 mg via ORAL
  Filled 2022-10-12: qty 2

## 2022-10-12 MED ORDER — CETIRIZINE HCL 10 MG PO TABS
10.0000 mg | ORAL_TABLET | Freq: Once | ORAL | Status: AC
  Administered 2022-10-12: 10 mg via ORAL
  Filled 2022-10-12: qty 1

## 2022-10-12 MED ORDER — SODIUM CHLORIDE 0.9 % IV SOLN
400.0000 mg | Freq: Once | INTRAVENOUS | Status: AC
  Administered 2022-10-12: 400 mg via INTRAVENOUS
  Filled 2022-10-12: qty 20

## 2022-10-12 NOTE — Patient Instructions (Signed)
[facility]  Discharge Instructions: Thank you for choosing [facility] to provide your oncology and hematology care.   If you have a lab appointment with the Cancer Center, please go directly to the Cancer Center and check in at the registration area.   Wear comfortable clothing and clothing appropriate for easy access to any Portacath or PICC line.   We strive to give you quality time with your provider. You may need to reschedule your appointment if you arrive late (15 or more minutes).  Arriving late affects you and other patients whose appointments are after yours.  Also, if you miss three or more appointments without notifying the office, you may be dismissed from the clinic at the provider's discretion.      For prescription refill requests, have your pharmacy contact our office and allow 72 hours for refills to be completed.    Today you received the following chemotherapy and/or immunotherapy agents: Venofer      To help prevent nausea and vomiting after your treatment, we encourage you to take your nausea medication as directed.  BELOW ARE SYMPTOMS THAT SHOULD BE REPORTED IMMEDIATELY: *FEVER GREATER THAN 100.4 F (38 C) OR HIGHER *CHILLS OR SWEATING *NAUSEA AND VOMITING THAT IS NOT CONTROLLED WITH YOUR NAUSEA MEDICATION *UNUSUAL SHORTNESS OF BREATH *UNUSUAL BRUISING OR BLEEDING *URINARY PROBLEMS (pain or burning when urinating, or frequent urination) *BOWEL PROBLEMS (unusual diarrhea, constipation, pain near the anus) TENDERNESS IN MOUTH AND THROAT WITH OR WITHOUT PRESENCE OF ULCERS (sore throat, sores in mouth, or a toothache) UNUSUAL RASH, SWELLING OR PAIN  UNUSUAL VAGINAL DISCHARGE OR ITCHING   Items with * indicate a potential emergency and should be followed up as soon as possible or go to the Emergency Department if any problems should occur.  Please show the CHEMOTHERAPY ALERT CARD or IMMUNOTHERAPY ALERT CARD at  check-in to the Emergency Department and triage nurse.  Should you have questions after your visit or need to cancel or reschedule your appointment, please contact [facility]  Dept: [phone]  and follow the prompts.  Office hours are 8:00 a.m. to 4:30 p.m. Monday - Friday. Please note that voicemails left after 4:00 p.m. may not be returned until the following business day.  We are closed weekends and major holidays. You have access to a nurse at all times for urgent questions. Please call the main number to the clinic Dept: [phone] and follow the prompts.   For any non-urgent questions, you may also contact your provider using MyChart. We now offer e-Visits for anyone 18 and older to request care online for non-urgent symptoms. For details visit [email or web address].   Also download the MyChart app! Go to the app store, search "MyChart", open the app, select [facility], and log in with your MyChart username and password.   

## 2022-10-12 NOTE — Progress Notes (Signed)
Pt declined to stay for post 30 min obs, discharged with VSS, ambulatory to lobby 

## 2022-11-19 ENCOUNTER — Telehealth: Payer: Self-pay | Admitting: Physician Assistant

## 2022-11-19 NOTE — Progress Notes (Unsigned)
[facility] OFFICE PROGRESS NOTE  Pcp, No No address on file  DIAGNOSIS:  Iron deficiency anemia.   PRIOR THERAPY: None   CURRENT THERAPY: 1) Oral iron supplement with OTC  2) Venofer 400 mg weekly, most recent dose on 10/12/22  INTERVAL HISTORY: Tina Arias 27 y.o. female returns to the clinic today for a follow up visit. She established care with the clinic on 09/18/22. She had significant IDA. She started oral iron supplements daily and is compliant with this. She also received IV iron, the most recent on 10/12/22 and tolerated this well. Since receiving her iron infusions, her symptoms have improved.   Overall regarding symptoms, she has some improvement in her fatigue and is less sleepy. She is in school for physical therapy.  She her lightheadedness and mild dyspnea on exertion are better at this time.  She denies gingival bleeding, epistaxis, hemoptysis, hematemesis, hematochezia, or melena.  Denies any changes in bowel habits. Her menstrual cycle finished yesterday.  She is here for evaluation and repeat blood work.       MEDICAL HISTORY: Past Medical History:  Diagnosis Date   Anemia    Family history of breast cancer     ALLERGIES:  has No Known Allergies.  MEDICATIONS:  Current Outpatient Medications  Medication Sig Dispense Refill   Prenatal Vit-Fe Fumarate-FA (PRENATAL MULTIVITAMIN) TABS tablet Take 1 tablet by mouth daily at 12 noon.     No current facility-administered medications for this visit.    SURGICAL HISTORY: No past surgical history on file.  REVIEW OF SYSTEMS:   Review of Systems  Constitutional: Negative for appetite change, chills, fatigue, fever and unexpected weight change.  HENT:   Negative for mouth sores, nosebleeds, sore throat and trouble swallowing.   Eyes: Negative for eye problems and icterus.  Respiratory: Negative for cough, hemoptysis, shortness of breath and wheezing.   Cardiovascular: Negative for chest pain and leg  swelling.  Gastrointestinal: Negative for abdominal pain, constipation, diarrhea, nausea and vomiting.  Genitourinary: Negative for bladder incontinence, difficulty urinating, dysuria, frequency and hematuria.   Musculoskeletal: Negative for back pain, gait problem, neck pain and neck stiffness.  Skin: Negative for itching and rash.  Neurological: Negative for dizziness, extremity weakness, gait problem, headaches, light-headedness and seizures.  Hematological: Negative for adenopathy. Does not bruise/bleed easily.  Psychiatric/Behavioral: Negative for confusion, depression and sleep disturbance. The patient is not nervous/anxious.     PHYSICAL EXAMINATION:  Blood pressure 122/84, pulse 72, temperature (!) 97.5 F (36.4 C), temperature source Temporal, resp. rate 15, weight 160 lb 4.8 oz (72.7 kg), SpO2 99 %.  ECOG PERFORMANCE STATUS: 0-1  Physical Exam  Constitutional: Oriented to person, place, and time and well-developed, well-nourished, and in no distress.  HENT:  Head: Normocephalic and atraumatic.  Mouth/Throat: Oropharynx is clear and moist. No oropharyngeal exudate.  Eyes: Conjunctivae are normal. Right eye exhibits no discharge. Left eye exhibits no discharge. No scleral icterus.  Neck: Normal range of motion. Neck supple.  Cardiovascular: Normal rate, regular rhythm, normal heart sounds and intact distal pulses.   Pulmonary/Chest: Effort normal and breath sounds normal. No respiratory distress. No wheezes. No rales.  Abdominal: Soft. Bowel sounds are normal. Exhibits no distension and no mass. There is no tenderness.  Musculoskeletal: Normal range of motion. Exhibits no edema.  Lymphadenopathy:    No cervical adenopathy.  Neurological: Alert and oriented to person, place, and time. Exhibits normal muscle tone. Gait normal. Coordination normal.  Skin: Skin is warm and  dry. No rash noted. Not diaphoretic. No erythema. No pallor.  Psychiatric: Mood, memory and judgment normal.   Vitals reviewed.  LABORATORY DATA: Lab Results  Component Value Date   WBC 4.1 11/21/2022   HGB 12.5 11/21/2022   HCT 41.2 11/21/2022   MCV 74.8 (L) 11/21/2022   PLT 198 11/21/2022      Chemistry      Component Value Date/Time   NA 140 09/18/2022 1328   K 3.6 09/18/2022 1328   CL 108 09/18/2022 1328   CO2 26 09/18/2022 1328   BUN 10 09/18/2022 1328   CREATININE 0.86 09/18/2022 1328      Component Value Date/Time   CALCIUM 9.6 09/18/2022 1328   ALKPHOS 42 09/18/2022 1328   AST 18 09/18/2022 1328   ALT 7 09/18/2022 1328   BILITOT 0.3 09/18/2022 1328       RADIOGRAPHIC STUDIES:  No results found.   ASSESSMENT/PLAN:  This is a very pleasant 46 -year-old female referred to the clinic for iron deficiency anemia.    She is currently on oral iron supplements with OTC iron. She also receives IV iron with venofer 400 mg weekly PRN. Her most recent dose was on 10/1022.   She had repeat labs today that showed normal Hbg. Her microcytosis is improving compared to prior.   Her iron studies and ferritin are still pending.  Unless this shows significantly low iron, we will hold off on any iron infusions at this time.  However she was advised to continue taking her over-the-counter iron supplement p.o. daily.  We will see her back for follow-up visit in 4 months for evaluation and repeat blood work with CBC, iron studies, ferritin.  The patient was advised to call immediately if she has any concerning symptoms in the interval. The patient voices understanding of current disease status and treatment options and is in agreement with the current care plan. All questions were answered. The patient knows to call the clinic with any problems, questions or concerns. We can certainly see the patient much sooner if necessary        Orders Placed This Encounter  Procedures   CBC with Differential (Cancer Center Only)    Standing Status:   Future    Standing Expiration Date:    11/21/2023   Ferritin    Standing Status:   Future    Standing Expiration Date:   11/21/2023   Iron and Iron Binding Capacity ([facility] only)    Standing Status:   Future    Standing Expiration Date:   11/21/2023     The total time spent in the appointment was 20-29 minutes.   Rykker Coviello L Zennie Ayars, PA-C 11/21/22

## 2022-11-21 ENCOUNTER — Other Ambulatory Visit: Payer: Self-pay

## 2022-11-21 ENCOUNTER — Inpatient Hospital Stay: Payer: BC Managed Care – PPO | Attending: Hematology and Oncology

## 2022-11-21 ENCOUNTER — Inpatient Hospital Stay (HOSPITAL_BASED_OUTPATIENT_CLINIC_OR_DEPARTMENT_OTHER): Payer: BC Managed Care – PPO | Admitting: Physician Assistant

## 2022-11-21 VITALS — BP 122/84 | HR 72 | Temp 97.5°F | Resp 15 | Wt 160.3 lb

## 2022-11-21 DIAGNOSIS — R0609 Other forms of dyspnea: Secondary | ICD-10-CM | POA: Insufficient documentation

## 2022-11-21 DIAGNOSIS — E611 Iron deficiency: Secondary | ICD-10-CM

## 2022-11-21 DIAGNOSIS — D509 Iron deficiency anemia, unspecified: Secondary | ICD-10-CM | POA: Diagnosis present

## 2022-11-21 DIAGNOSIS — R5383 Other fatigue: Secondary | ICD-10-CM | POA: Insufficient documentation

## 2022-11-21 DIAGNOSIS — Z79899 Other long term (current) drug therapy: Secondary | ICD-10-CM | POA: Diagnosis not present

## 2022-11-21 DIAGNOSIS — D500 Iron deficiency anemia secondary to blood loss (chronic): Secondary | ICD-10-CM

## 2022-11-21 DIAGNOSIS — R42 Dizziness and giddiness: Secondary | ICD-10-CM | POA: Insufficient documentation

## 2022-11-21 DIAGNOSIS — R718 Other abnormality of red blood cells: Secondary | ICD-10-CM | POA: Insufficient documentation

## 2022-11-21 DIAGNOSIS — Z803 Family history of malignant neoplasm of breast: Secondary | ICD-10-CM | POA: Insufficient documentation

## 2022-11-21 LAB — CBC WITH DIFFERENTIAL (CANCER CENTER ONLY)
Abs Immature Granulocytes: 0.01 10*3/uL (ref 0.00–0.07)
Basophils Absolute: 0 10*3/uL (ref 0.0–0.1)
Basophils Relative: 1 %
Eosinophils Absolute: 0.1 10*3/uL (ref 0.0–0.5)
Eosinophils Relative: 2 %
HCT: 41.2 % (ref 36.0–46.0)
Hemoglobin: 12.5 g/dL (ref 12.0–15.0)
Immature Granulocytes: 0 %
Lymphocytes Relative: 65 %
Lymphs Abs: 2.7 10*3/uL (ref 0.7–4.0)
MCH: 22.7 pg — ABNORMAL LOW (ref 26.0–34.0)
MCHC: 30.3 g/dL (ref 30.0–36.0)
MCV: 74.8 fL — ABNORMAL LOW (ref 80.0–100.0)
Monocytes Absolute: 0.2 10*3/uL (ref 0.1–1.0)
Monocytes Relative: 4 %
Neutro Abs: 1.2 10*3/uL — ABNORMAL LOW (ref 1.7–7.7)
Neutrophils Relative %: 28 %
Platelet Count: 198 10*3/uL (ref 150–400)
RBC: 5.51 MIL/uL — ABNORMAL HIGH (ref 3.87–5.11)
RDW: 23.9 % — ABNORMAL HIGH (ref 11.5–15.5)
WBC Count: 4.1 10*3/uL (ref 4.0–10.5)
nRBC: 0 % (ref 0.0–0.2)

## 2022-11-21 LAB — SAMPLE TO BLOOD BANK

## 2022-11-21 LAB — IRON AND IRON BINDING CAPACITY (CC-WL,HP ONLY)
Iron: 92 ug/dL (ref 28–170)
Saturation Ratios: 27 % (ref 10.4–31.8)
TIBC: 336 ug/dL (ref 250–450)
UIBC: 244 ug/dL (ref 148–442)

## 2022-11-21 LAB — FERRITIN: Ferritin: 41 ng/mL (ref 11–307)

## 2022-11-22 ENCOUNTER — Inpatient Hospital Stay: Payer: BC Managed Care – PPO

## 2022-11-22 ENCOUNTER — Inpatient Hospital Stay: Payer: BC Managed Care – PPO | Admitting: Physician Assistant

## 2023-03-20 ENCOUNTER — Inpatient Hospital Stay: Payer: BC Managed Care – PPO

## 2023-03-20 ENCOUNTER — Inpatient Hospital Stay: Payer: BC Managed Care – PPO | Admitting: Internal Medicine

## 2023-07-23 ENCOUNTER — Encounter (HOSPITAL_COMMUNITY): Payer: Self-pay

## 2023-07-23 ENCOUNTER — Ambulatory Visit (HOSPITAL_COMMUNITY)
Admission: EM | Admit: 2023-07-23 | Discharge: 2023-07-23 | Disposition: A | Discharge status: Home / Self Care | Payer: BC Managed Care – PPO | Attending: Physician Assistant | Admitting: Physician Assistant

## 2023-07-23 DIAGNOSIS — U071 COVID-19: Secondary | ICD-10-CM

## 2023-07-23 LAB — POC COVID19/FLU A&B COMBO
Covid Antigen, POC: POSITIVE — AB
Influenza A Antigen, POC: NEGATIVE
Influenza B Antigen, POC: NEGATIVE

## 2023-07-23 MED ORDER — ACETAMINOPHEN 325 MG PO TABS
ORAL_TABLET | ORAL | Status: AC
  Filled 2023-07-23: qty 2

## 2023-07-23 MED ORDER — ACETAMINOPHEN 325 MG PO TABS
650.0000 mg | ORAL_TABLET | Freq: Once | ORAL | Status: AC
  Administered 2023-07-23: 650 mg via ORAL

## 2023-07-23 MED ORDER — PAXLOVID (300/100) 20 X 150 MG & 10 X 100MG PO TBPK: 3.0000 | ORAL_TABLET | Freq: Two times a day (BID) | ORAL | 0 refills | Status: AC

## 2023-07-23 MED ORDER — PAXLOVID (300/100) 20 X 150 MG & 10 X 100MG PO TBPK: 3.0000 | ORAL_TABLET | Freq: Two times a day (BID) | ORAL | 0 refills | Status: DC

## 2023-07-23 NOTE — Discharge Instructions (Addendum)
 Your testing was positive for COVID  At this time you are still within the 5-day therapeutic window for the antiviral medication called Paxlovid.  I have sent this in to your pharmacy for you to start.  This medication can sometimes help reduce the severity of your symptoms as well as how long you are sick.  While you are taking the Paxlovid you should continue to take over-the-counter medications as needed for symptomatic relief.  You should try to quarantine and stay away from others for the first 5 days of feeling ill.  The next 5 days he should wear a mask if you go out in public.  If at any point you start to develop shortness of breath, chest pain, swelling in 1 extremity, fevers that are not responding to medications, nausea or vomiting to the point of dehydration you should go to the emergency room for evaluation and further management.

## 2023-07-23 NOTE — ED Provider Notes (Signed)
 [facility]    CSN: [id number] Arrival date & time: 07/23/23  0802      History   Chief Complaint Chief Complaint  Patient presents with   Cough    HPI Tina Arias is a 28 y.o. female.   HPI  She reports symptoms have been ongoing since Sun She states she has had fever, coughing, sore throat, body aches, rhinorrhea  Tmax: 102 last night Interventions: Dayquil and Nyquil  She denies recent sick contacts      Past Medical History:  Diagnosis Date   Anemia    Family history of breast cancer     Patient Active Problem List   Diagnosis Date Noted   Iron deficiency 09/18/2022   Iron deficiency anemia 09/18/2022   Family history of breast cancer     History reviewed. No pertinent surgical history.  OB History   No obstetric history on file.      Home Medications    Prior to Admission medications   Medication Sig Start Date End Date Taking? Authorizing Provider  nirmatrelvir/ritonavir (PAXLOVID, 300/100,) 20 x 150 MG & 10 x 100MG  TBPK Take 3 tablets by mouth 2 (two) times daily for 5 days. Patient GFR is >60. Take nirmatrelvir (150 mg) two tablets twice daily for 5 days and ritonavir (100 mg) one tablet twice daily for 5 days. 07/23/23 07/28/23  Jaleal Schliep, Oswaldo Conroy, PA-C  Prenatal Vit-Fe Fumarate-FA (PRENATAL MULTIVITAMIN) TABS tablet Take 1 tablet by mouth daily at 12 noon.    [provider]    Family History Family History  Problem Relation Age of Onset   Breast cancer Mother 45   Other Brother        car accident   Cervical cancer Maternal Grandmother        dx in her 30s   Throat cancer Maternal Grandfather        died in his 16s   Other Paternal Grandmother 77    Social History Social History   Tobacco Use   Smoking status: Never   Smokeless tobacco: Never  Vaping Use   Vaping status: Never Used  Substance Use Topics   Alcohol use: Yes    Comment: socially   Drug use: Never     Allergies   Patient has no known  allergies.   Review of Systems Review of Systems  Constitutional:  Positive for chills and fever.  HENT:  Positive for congestion, rhinorrhea, sneezing and sore throat.   Respiratory:  Positive for cough.   Gastrointestinal:  Negative for diarrhea, nausea and vomiting.  Musculoskeletal:  Positive for myalgias.     Physical Exam Triage Vital Signs ED Triage Vitals  Encounter Vitals Group     BP 07/23/23 0839 122/83     Systolic BP Percentile --      Diastolic BP Percentile --      Pulse Rate 07/23/23 0839 (!) 124     Resp 07/23/23 0839 16     Temp 07/23/23 0839 (!) 101.4 F (38.6 C)     Temp Source 07/23/23 0839 Oral     SpO2 07/23/23 0839 96 %     Weight 07/23/23 0839 160 lb (72.6 kg)     Height 07/23/23 0839 5\' 9"  (1.753 m)     Head Circumference --      Peak Flow --      Pain Score 07/23/23 0838 4     Pain Loc --      Pain Education --  Exclude from Growth Chart --    No data found.  Updated Vital Signs BP 122/83 (BP Location: Left Arm)   Pulse (!) 124   Temp (!) 101.4 F (38.6 C) (Oral)   Resp 16   Ht 5\' 9"  (1.753 m)   Wt 160 lb (72.6 kg)   LMP 07/11/2023 (Approximate)   SpO2 96%   BMI 23.63 kg/m   Visual Acuity Right Eye Distance:   Left Eye Distance:   Bilateral Distance:    Right Eye Near:   Left Eye Near:    Bilateral Near:     Physical Exam Vitals reviewed.  Constitutional:      General: She is awake.     Appearance: Normal appearance. She is well-developed and well-groomed.  HENT:     Head: Normocephalic and atraumatic.     Right Ear: Hearing, tympanic membrane and ear canal normal.     Left Ear: Hearing, tympanic membrane and ear canal normal.     Mouth/Throat:     Lips: Pink.     Mouth: Mucous membranes are moist.     Pharynx: Oropharynx is clear. Uvula midline. No pharyngeal swelling, oropharyngeal exudate, posterior oropharyngeal erythema, uvula swelling or postnasal drip.  Cardiovascular:     Rate and Rhythm: Normal rate and  regular rhythm.     Pulses: Normal pulses.          Radial pulses are 2+ on the right side and 2+ on the left side.     Heart sounds: Normal heart sounds. No murmur heard.    No friction rub. No gallop.  Pulmonary:     Effort: Pulmonary effort is normal.     Breath sounds: Normal breath sounds. No decreased air movement. No decreased breath sounds, wheezing, rhonchi or rales.  Musculoskeletal:     Cervical back: Normal range of motion and neck supple.  Lymphadenopathy:     Head:     Right side of head: No submental, submandibular or preauricular adenopathy.     Left side of head: No submental, submandibular or preauricular adenopathy.     Cervical:     Right cervical: No superficial cervical adenopathy.    Left cervical: No superficial cervical adenopathy.     Upper Body:     Right upper body: No supraclavicular adenopathy.     Left upper body: No supraclavicular adenopathy.  Skin:    General: Skin is warm and dry.  Neurological:     General: No focal deficit present.     Mental Status: She is alert and oriented to person, place, and time.  Psychiatric:        Mood and Affect: Mood normal.        Behavior: Behavior normal. Behavior is cooperative.        Thought Content: Thought content normal.        Judgment: Judgment normal.      UC Treatments / Results  Labs (all labs ordered are listed, but only abnormal results are displayed) Labs Reviewed  POC COVID19/FLU A&B COMBO - Abnormal; Notable for the following components:      Result Value   Covid Antigen, POC Positive (*)    All other components within normal limits    EKG   Radiology No results found.  Procedures Procedures (including critical care time)  Medications Ordered in UC Medications  acetaminophen (TYLENOL) tablet 650 mg (650 mg Oral Given 07/23/23 0846)    Initial Impression / Assessment and Plan / UC Course  I have reviewed the triage vital signs and the nursing notes.  Pertinent labs & imaging  results that were available during my care of the patient were reviewed by me and considered in my medical decision making (see chart for details).      Final Clinical Impressions(s) / UC Diagnoses   Final diagnoses:  COVID-19    Acute, new concern Patient reports that she has had cough, congestion, fever, chills, rhinorrhea for the past 2 days.  Testing was positive for COVID.  Results were reviewed with patient during appointment.  Discussed potential treatment options as well as antiviral medications.  She is amenable to trying Paxlovid.  Reviewed that she should continue to take over-the-counter medications for symptomatic relief.  ED and return precautions reviewed and provided in after visit summary.  Follow-up as needed for progressing or persistent symptoms    Discharge Instructions      Your testing was positive for COVID  At this time you are still within the 5-day therapeutic window for the antiviral medication called Paxlovid.  I have sent this in to your pharmacy for you to start.  This medication can sometimes help reduce the severity of your symptoms as well as how long you are sick.  While you are taking the Paxlovid you should continue to take over-the-counter medications as needed for symptomatic relief.  You should try to quarantine and stay away from others for the first 5 days of feeling ill.  The next 5 days he should wear a mask if you go out in public.  If at any point you start to develop shortness of breath, chest pain, swelling in 1 extremity, fevers that are not responding to medications, nausea or vomiting to the point of dehydration you should go to the emergency room for evaluation and further management.     ED Prescriptions     Medication Sig Dispense Auth. Provider   nirmatrelvir/ritonavir (PAXLOVID, 300/100,) 20 x 150 MG & 10 x 100MG  TBPK  (Status: Discontinued) Take 3 tablets by mouth 2 (two) times daily for 5 days. Patient GFR is >60. Take  nirmatrelvir (150 mg) two tablets twice daily for 5 days and ritonavir (100 mg) one tablet twice daily for 5 days. 30 tablet Vinetta Brach E, PA-C   nirmatrelvir/ritonavir (PAXLOVID, 300/100,) 20 x 150 MG & 10 x 100MG  TBPK Take 3 tablets by mouth 2 (two) times daily for 5 days. Patient GFR is >60. Take nirmatrelvir (150 mg) two tablets twice daily for 5 days and ritonavir (100 mg) one tablet twice daily for 5 days. 30 tablet Markella Dao E, PA-C      PDMP not reviewed this encounter.   Providence Crosby, PA-C 07/23/23 [id number]

## 2023-07-23 NOTE — ED Triage Notes (Signed)
 Patient here today with c/o cough, fever, runny nose, body aches, chills, sweats, and ST since Sunday. She has been taking Dayquil and Nyquil with no relief. No known sick contacts.
